# Patient Record
Sex: Male | Born: 1970 | Race: Black or African American | Hispanic: No | Marital: Single | Smoking: Never smoker
Health system: Southern US, Community
[De-identification: ages and names within clinical notes are randomized; demographics above are authoritative.]

## PROBLEM LIST (undated history)

## (undated) DIAGNOSIS — G4730 Sleep apnea, unspecified: Secondary | ICD-10-CM

## (undated) DIAGNOSIS — E785 Hyperlipidemia, unspecified: Secondary | ICD-10-CM

## (undated) HISTORY — PX: KNEE ARTHROSCOPY W/ ACL RECONSTRUCTION: SHX1858

---

## 2004-10-18 ENCOUNTER — Ambulatory Visit: Payer: Self-pay | Admitting: Unknown Physician Specialty

## 2007-08-28 ENCOUNTER — Ambulatory Visit: Payer: Self-pay

## 2014-03-16 ENCOUNTER — Emergency Department: Payer: Self-pay | Admitting: Emergency Medicine

## 2016-08-21 IMAGING — CT CT ABD-PELV W/ CM
2 of 6 series · 15 of 46 positions shown, 17 images · IV contrast (agent unspecified)
Comparison: None.

CLINICAL DATA: 44-year-old male with a history of right lower
quadrant pain and right flank pain.

EXAM:
CT ABDOMEN AND PELVIS WITH CONTRAST
TECHNIQUE: Multidetector CT imaging of the abdomen and pelvis was performed
using the standard protocol following bolus administration of
intravenous contrast.
CONTRAST:  IV contrast

[Series 2: routine abd pel with · axial · 0.65mm/px · z∈[-453,-63]mm · 12 of 90 slices shown, 14 images]
[im 6/90  soft-tissue]
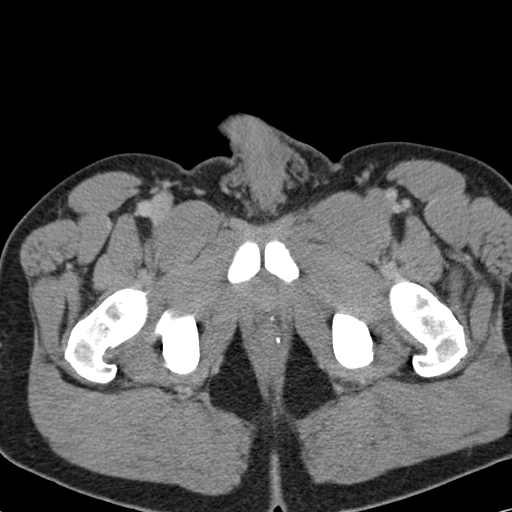
[im 6/90  bone]
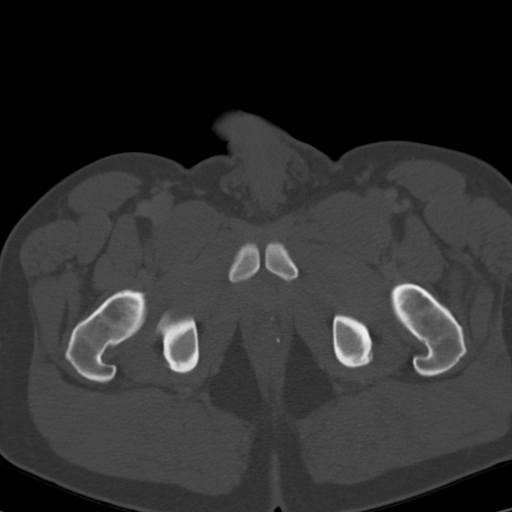
[im 16/90  soft-tissue]
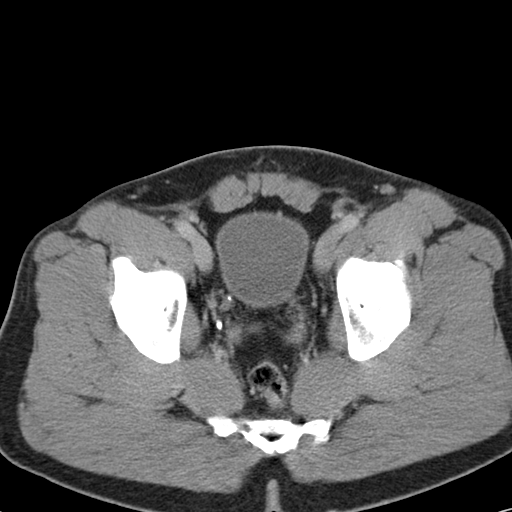
[im 21/90  soft-tissue]
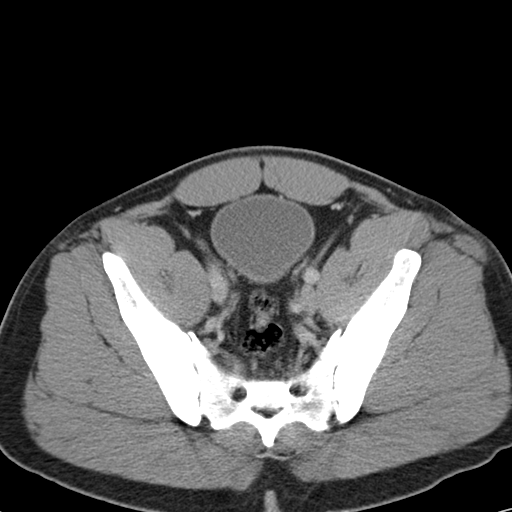
[im 27/90  soft-tissue]
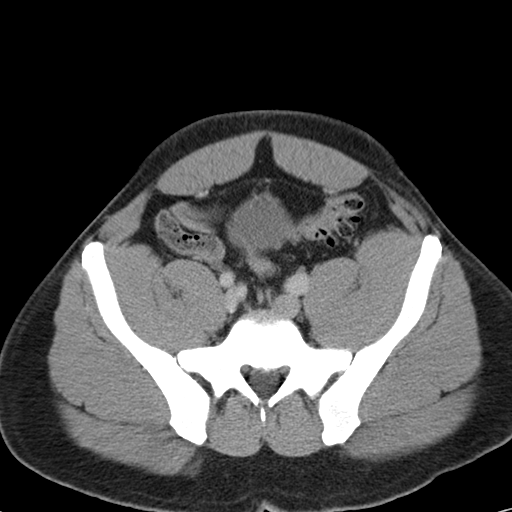
[im 37/90  soft-tissue]
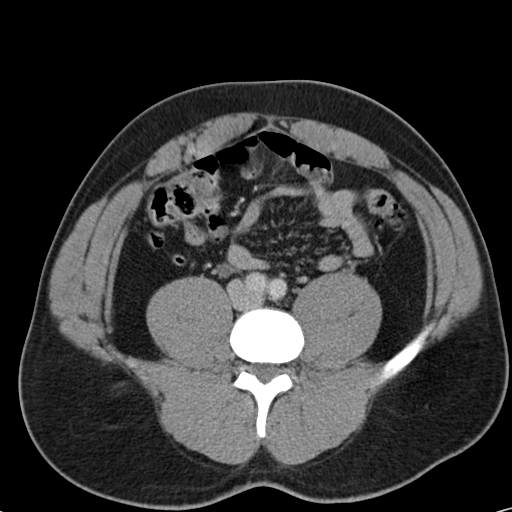
[im 42/90  soft-tissue]
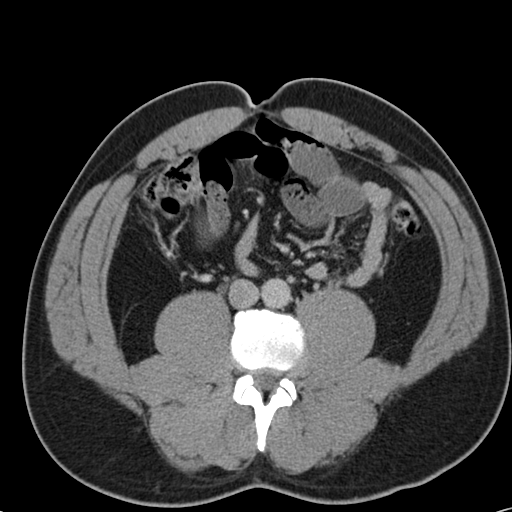
[im 48/90  soft-tissue]
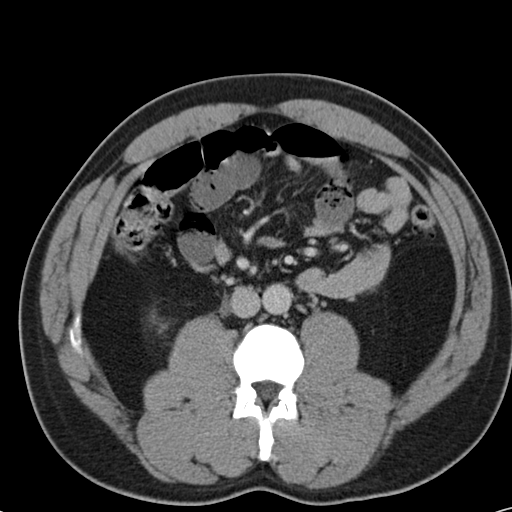
[im 58/90  soft-tissue]
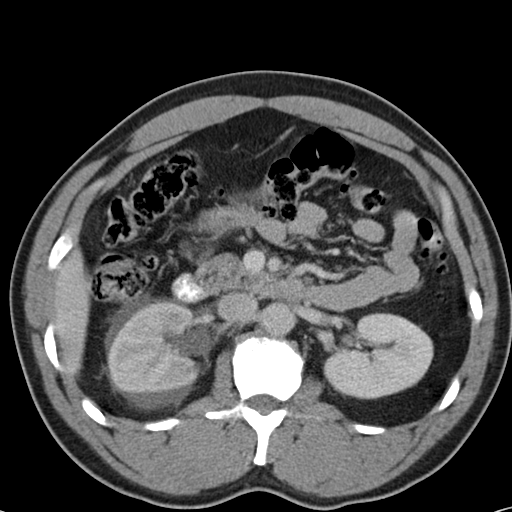
[im 63/90  soft-tissue]
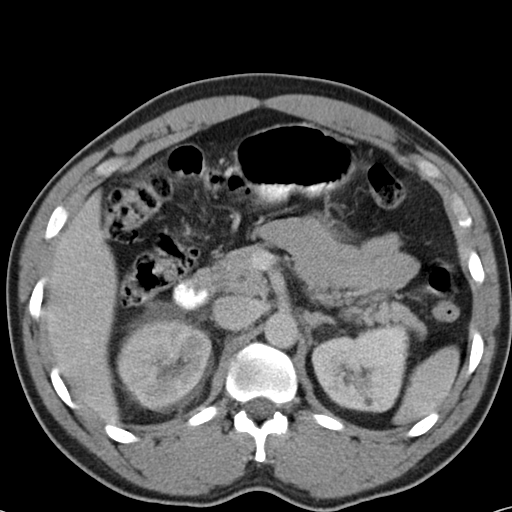
[im 63/90  bone]
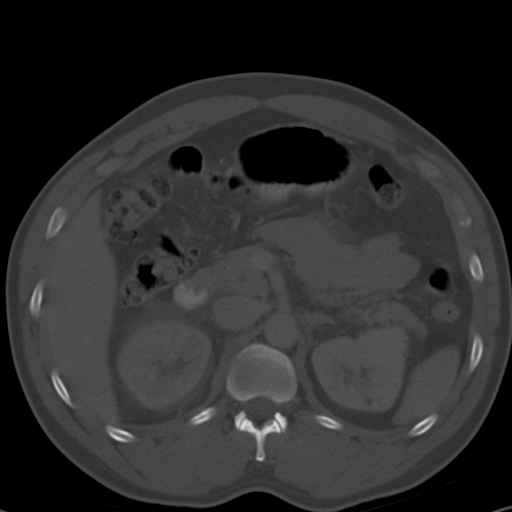
[im 69/90  soft-tissue]
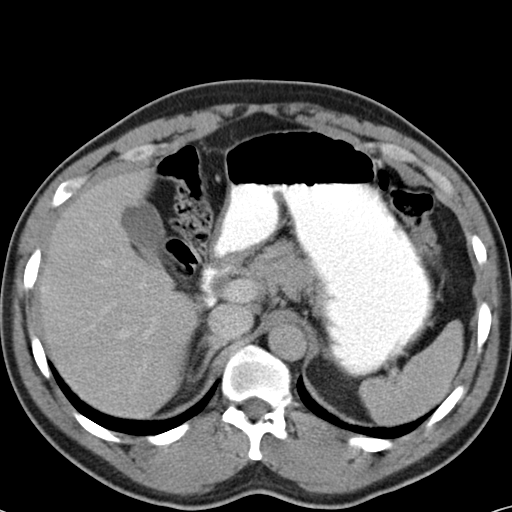
[im 79/90  soft-tissue]
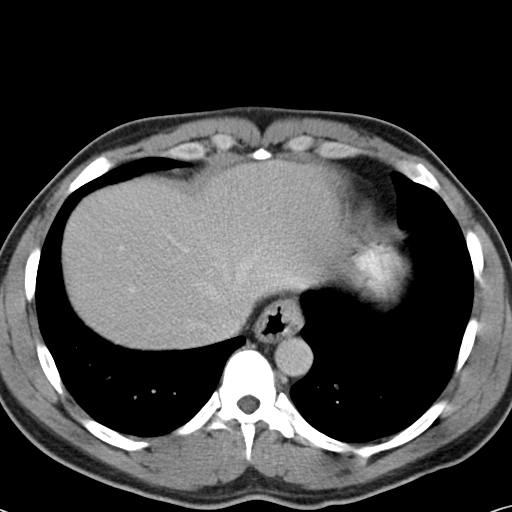
[im 84/90  soft-tissue]
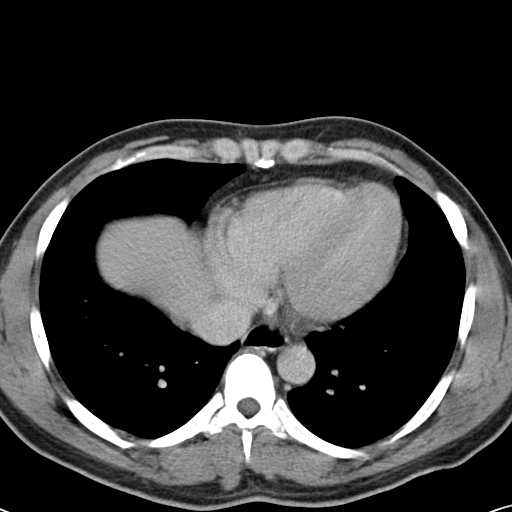

[Series 7: cor routine abd pel with · coronal · 0.64mm/px · 3 of 134 slices shown]
[im 45/134  soft-tissue]
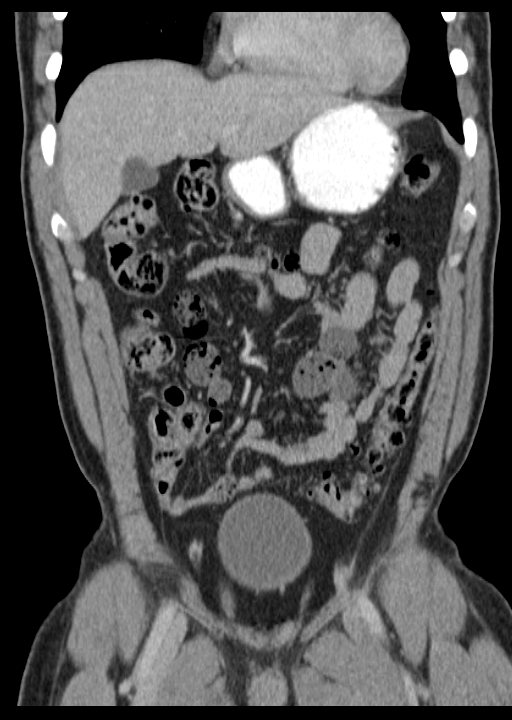
[im 60/134  soft-tissue]
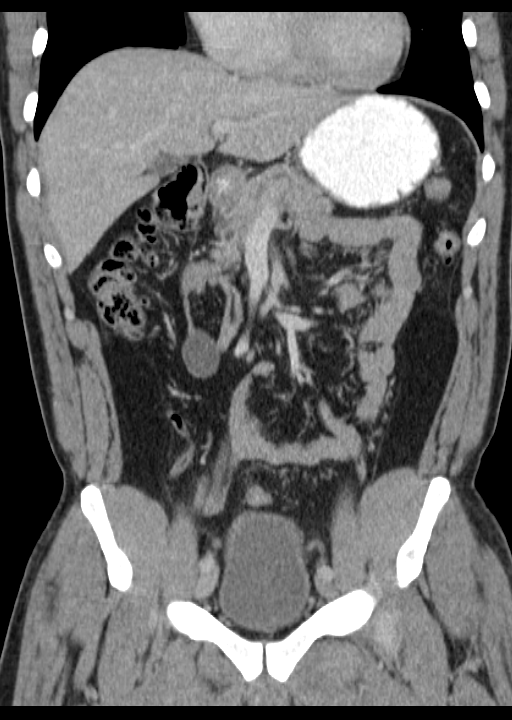
[im 74/134  soft-tissue]
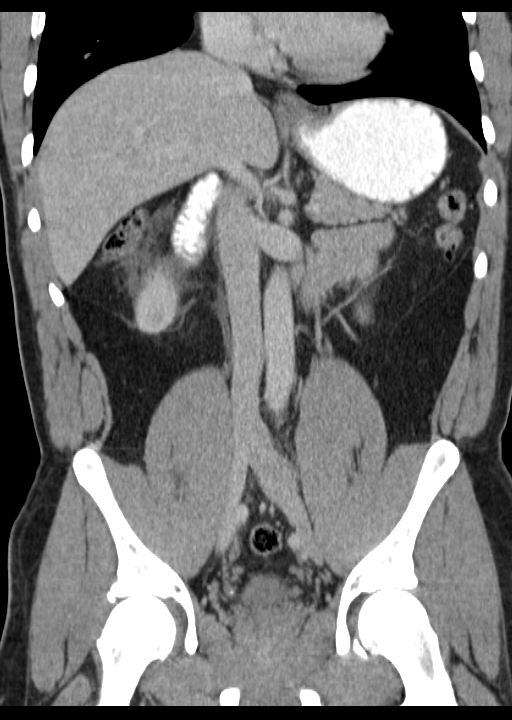

[15 of 46 positions shown; findings below may reference images not displayed]

FINDINGS: Lower chest:

Unremarkable appearance of the soft tissues of the chest wall.

Heart size within normal limits.  No pericardial fluid/thickening.

No lower mediastinal adenopathy.

Unremarkable appearance of the distal esophagus.

Small hiatal hernia

No confluent airspace disease, pleural fluid, or pneumothorax within
visualized lung.

Abdomen/pelvis:

Unremarkable appearance of liver and spleen.

Unremarkable appearance of the adrenal glands.

No peripancreatic or pericholecystic fluid or inflammation. No
radiopaque gallstones.

No intrahepatic or extrahepatic biliary ductal dilatation.

No free air or significant free fluid within the peritoneum.

Significant perinephric stranding/fluid on the right, with mild
hydronephrosis. There is dilation of the entire right ureter, and a
small, 2 mm ureteral stone just above the ureterovesical junction.

Unremarkable appearance of the left ureter. There is a left sided
small nonobstructive 2 mm stone. No left-sided hydronephrosis.

Unremarkable appearance of the urinary bladder.

Enteric contrast within the stomach, which has not extended beyond
the duodenum.

No abnormally distended small bowel or colon.  Normal appendix.

Multiple colonic diverticula.
IMPRESSION: Obstructive 2 mm right ureteral stone just proximal to the
ureterovesical junction. There is resulting mild hydronephrosis,
with right-sided perinephric stranding. If there is concern for
infection, recommend correlation with urinalysis.

Small nonobstructive left-sided kidney stone measuring 2 mm in the
lateral cortex/calyx.

Diverticular disease without associated inflammation.

These results were called by telephone at the time of interpretation
on 03/16/2014 at [DATE] to Dr. ISARIS GOBETTO , who verbally
acknowledged these results.

## 2020-01-09 ENCOUNTER — Other Ambulatory Visit: Payer: Managed Care, Other (non HMO) | Attending: Gastroenterology

## 2020-01-11 ENCOUNTER — Ambulatory Visit: Admission: RE | Admit: 2020-01-11 | Payer: Managed Care, Other (non HMO) | Source: Home / Self Care

## 2020-01-11 ENCOUNTER — Encounter: Admission: RE | Payer: Self-pay | Source: Home / Self Care

## 2020-01-11 SURGERY — COLONOSCOPY WITH PROPOFOL
Anesthesia: General

## 2021-09-02 ENCOUNTER — Other Ambulatory Visit: Payer: Self-pay | Admitting: Student

## 2021-09-02 DIAGNOSIS — Z136 Encounter for screening for cardiovascular disorders: Secondary | ICD-10-CM

## 2021-09-08 ENCOUNTER — Other Ambulatory Visit: Payer: Managed Care, Other (non HMO)

## 2021-10-06 ENCOUNTER — Ambulatory Visit
Admission: RE | Admit: 2021-10-06 | Discharge: 2021-10-06 | Disposition: A | Discharge status: Home / Self Care | Payer: Commercial Managed Care - HMO | Source: Ambulatory Visit | Attending: Student | Admitting: Student

## 2021-10-06 DIAGNOSIS — Z136 Encounter for screening for cardiovascular disorders: Secondary | ICD-10-CM | POA: Insufficient documentation

## 2022-02-03 DIAGNOSIS — G4733 Obstructive sleep apnea (adult) (pediatric): Secondary | ICD-10-CM | POA: Diagnosis not present

## 2022-02-07 DIAGNOSIS — G4733 Obstructive sleep apnea (adult) (pediatric): Secondary | ICD-10-CM | POA: Diagnosis not present

## 2022-03-06 DIAGNOSIS — G4733 Obstructive sleep apnea (adult) (pediatric): Secondary | ICD-10-CM | POA: Diagnosis not present

## 2022-03-10 DIAGNOSIS — G4733 Obstructive sleep apnea (adult) (pediatric): Secondary | ICD-10-CM | POA: Diagnosis not present

## 2022-03-30 DIAGNOSIS — Z1211 Encounter for screening for malignant neoplasm of colon: Secondary | ICD-10-CM | POA: Diagnosis not present

## 2022-03-30 DIAGNOSIS — Z01818 Encounter for other preprocedural examination: Secondary | ICD-10-CM | POA: Diagnosis not present

## 2022-04-04 DIAGNOSIS — G4733 Obstructive sleep apnea (adult) (pediatric): Secondary | ICD-10-CM | POA: Diagnosis not present

## 2022-04-08 DIAGNOSIS — G4733 Obstructive sleep apnea (adult) (pediatric): Secondary | ICD-10-CM | POA: Diagnosis not present

## 2022-04-12 DIAGNOSIS — G4733 Obstructive sleep apnea (adult) (pediatric): Secondary | ICD-10-CM | POA: Diagnosis not present

## 2022-04-12 DIAGNOSIS — R7302 Impaired glucose tolerance (oral): Secondary | ICD-10-CM | POA: Diagnosis not present

## 2022-04-12 DIAGNOSIS — E7800 Pure hypercholesterolemia, unspecified: Secondary | ICD-10-CM | POA: Diagnosis not present

## 2022-04-12 DIAGNOSIS — N200 Calculus of kidney: Secondary | ICD-10-CM | POA: Diagnosis not present

## 2022-05-05 DIAGNOSIS — G4733 Obstructive sleep apnea (adult) (pediatric): Secondary | ICD-10-CM | POA: Diagnosis not present

## 2022-05-08 DIAGNOSIS — G4733 Obstructive sleep apnea (adult) (pediatric): Secondary | ICD-10-CM | POA: Diagnosis not present

## 2022-06-04 DIAGNOSIS — G4733 Obstructive sleep apnea (adult) (pediatric): Secondary | ICD-10-CM | POA: Diagnosis not present

## 2022-06-07 DIAGNOSIS — G4733 Obstructive sleep apnea (adult) (pediatric): Secondary | ICD-10-CM | POA: Diagnosis not present

## 2022-06-25 ENCOUNTER — Encounter: Payer: Self-pay | Admitting: Gastroenterology

## 2022-06-27 NOTE — H&P (Signed)
Pre-Procedure H&P   Patient ID: Bruce Donovan. is a 52 y.o. male.  Gastroenterology Provider: Jaynie Collins, DO  Referring Provider: Dr. Maryjane Hurter PCP: Marina Goodell, MD  Date: 06/28/2022  HPI Bruce Donovan. is a 52 y.o. male who presents today for Colonoscopy for Colorectal cancer screening .  Undergoing initial colonoscopy.  Patient reports regular bowel movements daily without melena or hematochezia.  Creatinine 1.3 hemoglobin 14.8 MCV 86.7  No family history of colon cancer or colon polyps   Past Medical History:  Diagnosis Date   HLD (hyperlipidemia)    Sleep apnea     Past Surgical History:  Procedure Laterality Date   KNEE ARTHROSCOPY W/ ACL RECONSTRUCTION Bilateral     Family History No h/o GI disease or malignancy  Review of Systems  Constitutional:  Negative for activity change, appetite change, chills, diaphoresis, fatigue, fever and unexpected weight change.  HENT:  Negative for trouble swallowing and voice change.   Respiratory:  Negative for shortness of breath and wheezing.   Cardiovascular:  Negative for chest pain, palpitations and leg swelling.  Gastrointestinal:  Negative for abdominal distention, abdominal pain, anal bleeding, blood in stool, constipation, diarrhea, nausea and vomiting.  Musculoskeletal:  Negative for arthralgias and myalgias.  Skin:  Negative for color change and pallor.  Neurological:  Negative for dizziness, syncope and weakness.  Psychiatric/Behavioral:  Negative for confusion. The patient is not nervous/anxious.   All other systems reviewed and are negative.    Medications No current facility-administered medications on file prior to encounter.   Current Outpatient Medications on File Prior to Encounter  Medication Sig Dispense Refill   atorvastatin (LIPITOR) 10 MG tablet Take 10 mg by mouth daily.      Pertinent medications related to GI and procedure were reviewed by me with the patient prior to  the procedure   Current Facility-Administered Medications:    0.9 %  sodium chloride infusion, , Intravenous, Continuous, Jaynie Collins, DO, Last Rate: 20 mL/hr at 06/28/22 1024, New Bag at 06/28/22 1024  sodium chloride 20 mL/hr at 06/28/22 1024       Not on File Allergies were reviewed by me prior to the procedure  Objective   Body mass index is 31.8 kg/m. Vitals:   06/28/22 1013  BP: (!) 135/91  Pulse: 75  Resp: 18  Temp: (!) 97.1 F (36.2 C)  TempSrc: Temporal  SpO2: 98%  Weight: 103.4 kg  Height: 5\' 11"  (1.803 m)     Physical Exam Vitals and nursing note reviewed.  Constitutional:      General: He is not in acute distress.    Appearance: Normal appearance. He is not ill-appearing, toxic-appearing or diaphoretic.  HENT:     Head: Normocephalic and atraumatic.     Nose: Nose normal.     Mouth/Throat:     Mouth: Mucous membranes are moist.     Pharynx: Oropharynx is clear.  Eyes:     General: No scleral icterus.    Extraocular Movements: Extraocular movements intact.  Cardiovascular:     Rate and Rhythm: Normal rate and regular rhythm.     Heart sounds: Normal heart sounds. No murmur heard.    No friction rub. No gallop.  Pulmonary:     Effort: Pulmonary effort is normal. No respiratory distress.     Breath sounds: Normal breath sounds. No wheezing, rhonchi or rales.  Abdominal:     General: Bowel sounds are normal. There is no  distension.     Palpations: Abdomen is soft.     Tenderness: There is no abdominal tenderness. There is no guarding or rebound.  Musculoskeletal:     Cervical back: Neck supple.     Right lower leg: No edema.     Left lower leg: No edema.  Skin:    General: Skin is warm and dry.     Coloration: Skin is not jaundiced or pale.  Neurological:     General: No focal deficit present.     Mental Status: He is alert and oriented to person, place, and time. Mental status is at baseline.  Psychiatric:        Mood and Affect:  Mood normal.        Behavior: Behavior normal.        Thought Content: Thought content normal.        Judgment: Judgment normal.      Assessment:  Mr. Bruce Donovan. is a 52 y.o. male  who presents today for Colonoscopy for Colorectal cancer screening .  Plan:  Colonoscopy with possible intervention today  Colonoscopy with possible biopsy, control of bleeding, polypectomy, and interventions as necessary has been discussed with the patient/patient representative. Informed consent was obtained from the patient/patient representative after explaining the indication, nature, and risks of the procedure including but not limited to death, bleeding, perforation, missed neoplasm/lesions, cardiorespiratory compromise, and reaction to medications. Opportunity for questions was given and appropriate answers were provided. Patient/patient representative has verbalized understanding is amenable to undergoing the procedure.   Jaynie Collins, DO  [facility]  Portions of the record may have been created with voice recognition software. Occasional wrong-word or 'sound-a-like' substitutions may have occurred due to the inherent limitations of voice recognition software.  Read the chart carefully and recognize, using context, where substitutions may have occurred.

## 2022-06-28 ENCOUNTER — Other Ambulatory Visit: Payer: Self-pay

## 2022-06-28 ENCOUNTER — Ambulatory Visit: Payer: Medicaid Other | Admitting: Anesthesiology

## 2022-06-28 ENCOUNTER — Ambulatory Visit
Admission: RE | Admit: 2022-06-28 | Discharge: 2022-06-28 | Disposition: A | Discharge status: Home / Self Care | Payer: Medicaid Other | Source: Ambulatory Visit | Attending: Gastroenterology | Admitting: Gastroenterology

## 2022-06-28 ENCOUNTER — Encounter: Payer: Self-pay | Admitting: Gastroenterology

## 2022-06-28 ENCOUNTER — Encounter
Admission: RE | Disposition: A | Discharge status: Home / Self Care | Payer: Self-pay | Source: Ambulatory Visit | Attending: Gastroenterology

## 2022-06-28 DIAGNOSIS — K5730 Diverticulosis of large intestine without perforation or abscess without bleeding: Secondary | ICD-10-CM | POA: Insufficient documentation

## 2022-06-28 DIAGNOSIS — K648 Other hemorrhoids: Secondary | ICD-10-CM | POA: Diagnosis not present

## 2022-06-28 DIAGNOSIS — D124 Benign neoplasm of descending colon: Secondary | ICD-10-CM | POA: Diagnosis not present

## 2022-06-28 DIAGNOSIS — K640 First degree hemorrhoids: Secondary | ICD-10-CM | POA: Insufficient documentation

## 2022-06-28 DIAGNOSIS — D122 Benign neoplasm of ascending colon: Secondary | ICD-10-CM | POA: Insufficient documentation

## 2022-06-28 DIAGNOSIS — D125 Benign neoplasm of sigmoid colon: Secondary | ICD-10-CM | POA: Insufficient documentation

## 2022-06-28 DIAGNOSIS — D120 Benign neoplasm of cecum: Secondary | ICD-10-CM | POA: Diagnosis not present

## 2022-06-28 DIAGNOSIS — Z1211 Encounter for screening for malignant neoplasm of colon: Secondary | ICD-10-CM | POA: Diagnosis not present

## 2022-06-28 DIAGNOSIS — K635 Polyp of colon: Secondary | ICD-10-CM | POA: Diagnosis not present

## 2022-06-28 HISTORY — DX: Sleep apnea, unspecified: G47.30

## 2022-06-28 HISTORY — DX: Hyperlipidemia, unspecified: E78.5

## 2022-06-28 HISTORY — PX: COLONOSCOPY WITH PROPOFOL: SHX5780

## 2022-06-28 SURGERY — COLONOSCOPY WITH PROPOFOL
Anesthesia: General

## 2022-06-28 MED ORDER — DEXMEDETOMIDINE HCL IN NACL 80 MCG/20ML IV SOLN
INTRAVENOUS | Status: DC | PRN
  Administered 2022-06-28: 8 ug via INTRAVENOUS

## 2022-06-28 MED ORDER — LIDOCAINE HCL (CARDIAC) PF 50 MG/5ML IV SOSY
PREFILLED_SYRINGE | INTRAVENOUS | Status: DC | PRN
  Administered 2022-06-28: 100 mg via INTRAVENOUS

## 2022-06-28 MED ORDER — PROPOFOL 500 MG/50ML IV EMUL
INTRAVENOUS | Status: DC | PRN
  Administered 2022-06-28: 150 ug/kg/min via INTRAVENOUS
  Administered 2022-06-28: 50 mg via INTRAVENOUS

## 2022-06-28 MED ORDER — SODIUM CHLORIDE 0.9 % IV SOLN: INTRAVENOUS | Status: DC

## 2022-06-28 NOTE — Anesthesia Preprocedure Evaluation (Addendum)
Anesthesia Evaluation  Patient identified by MRN, date of birth, ID band Patient awake    Reviewed: Allergy & Precautions, H&P , NPO status , Patient's Chart, lab work & pertinent test results  Airway Mallampati: III  TM Distance: >3 FB Neck ROM: full    Dental  (+) Partial Lower   Pulmonary sleep apnea    Pulmonary exam normal        Cardiovascular negative cardio ROS Normal cardiovascular exam     Neuro/Psych negative neurological ROS  negative psych ROS   GI/Hepatic negative GI ROS, Neg liver ROS,,,  Endo/Other  negative endocrine ROS    Renal/GU negative Renal ROS  negative genitourinary   Musculoskeletal   Abdominal  (+) + obese  Peds  Hematology negative hematology ROS (+)   Anesthesia Other Findings    Reproductive/Obstetrics negative OB ROS                             Anesthesia Physical Anesthesia Plan  ASA: 2  Anesthesia Plan: General   Post-op Pain Management: Minimal or no pain anticipated   Induction: Intravenous  PONV Risk Score and Plan: Propofol infusion and TIVA  Airway Management Planned: Natural Airway  Additional Equipment:   Intra-op Plan:   Post-operative Plan:   Informed Consent: I have reviewed the patients History and Physical, chart, labs and discussed the procedure including the risks, benefits and alternatives for the proposed anesthesia with the patient or authorized representative who has indicated his/her understanding and acceptance.     Dental Advisory Given  Plan Discussed with: CRNA and Surgeon  Anesthesia Plan Comments:         Anesthesia Quick Evaluation

## 2022-06-28 NOTE — Anesthesia Postprocedure Evaluation (Signed)
Anesthesia Post Note  Patient: Berley Tibbetts.  Procedure(s) Performed: COLONOSCOPY WITH PROPOFOL  Patient location during evaluation: Endoscopy Anesthesia Type: General Level of consciousness: awake and alert Pain management: pain level controlled Vital Signs Assessment: post-procedure vital signs reviewed and stable Respiratory status: spontaneous breathing, nonlabored ventilation and respiratory function stable Cardiovascular status: blood pressure returned to baseline and stable Postop Assessment: no apparent nausea or vomiting Anesthetic complications: no   There were no known notable events for this encounter.   Last Vitals:  Vitals:   06/28/22 1129 06/28/22 1139  BP: (!) 142/96 (!) 153/110  Pulse: 61 62  Resp: 20 17  Temp:    SpO2: 99% 100%    Last Pain:  Vitals:   06/28/22 1139  TempSrc:   PainSc: 0-No pain                 Foye Deer

## 2022-06-28 NOTE — Interval H&P Note (Signed)
History and Physical Interval Note: Preprocedure H&P from 06/28/22  was reviewed and there was no interval change after seeing and examining the patient.  Written consent was obtained from the patient after discussion of risks, benefits, and alternatives. Patient has consented to proceed with Colonoscopy with possible intervention   06/28/2022 10:37 AM  Bruce Donovan.  has presented today for surgery, with the diagnosis of colon cancer screening.  The various methods of treatment have been discussed with the patient and family. After consideration of risks, benefits and other options for treatment, the patient has consented to  Procedure(s): COLONOSCOPY WITH PROPOFOL (N/A) as a surgical intervention.  The patient's history has been reviewed, patient examined, no change in status, stable for surgery.  I have reviewed the patient's chart and labs.  Questions were answered to the patient's satisfaction.     Jaynie Collins

## 2022-06-28 NOTE — Transfer of Care (Signed)
Immediate Anesthesia Transfer of Care Note  Patient: Bruce Donovan.  Procedure(s) Performed: COLONOSCOPY WITH PROPOFOL  Patient Location: PACU  Anesthesia Type:General  Level of Consciousness: awake, alert , and oriented  Airway & Oxygen Therapy: Patient Spontanous Breathing  Post-op Assessment: Report given to RN and Post -op Vital signs reviewed and stable  Post vital signs: stable  Last Vitals:  Vitals Value Taken Time  BP 132/85 06/28/22 1119  Temp 36.1 C 06/28/22 1119  Pulse 61 06/28/22 1121  Resp 17 06/28/22 1121  SpO2 100 % 06/28/22 1121  Vitals shown include unvalidated device data.  Last Pain:  Vitals:   06/28/22 1013  TempSrc: Temporal  PainSc: 0-No pain         Complications: No notable events documented.

## 2022-06-28 NOTE — Op Note (Signed)
[facility] Gastroenterology Patient Name: Bruce Donovan Procedure Date: 06/28/2022 10:29 AM MRN: [id number] Account #: [id number] Date of Birth: 1970/11/04 Admit Type: Outpatient Age: 52 Room: [facility] ENDO ROOM 2 Gender: Male Note Status: Finalized Instrument Name: Colonoscope [id number] Procedure:             Colonoscopy Indications:           Screening for colorectal malignant neoplasm Providers:             Jaynie Collins DO, DO Medicines:             Monitored Anesthesia Care Complications:         No immediate complications. Estimated blood loss:                         Minimal. Procedure:             Pre-Anesthesia Assessment:                        - Prior to the procedure, a History and Physical was                         performed, and patient medications and allergies were                         reviewed. The patient is competent. The risks and                         benefits of the procedure and the sedation options and                         risks were discussed with the patient. All questions                         were answered and informed consent was obtained.                         Patient identification and proposed procedure were                         verified by the physician, the nurse, the anesthetist                         and the technician in the endoscopy suite. Mental                         Status Examination: alert and oriented. Airway                         Examination: normal oropharyngeal airway and neck                         mobility. Respiratory Examination: clear to                         auscultation. CV Examination: RRR, no murmurs, no S3                         or S4. Prophylactic Antibiotics: The patient does not  require prophylactic antibiotics. Prior                         Anticoagulants: The patient has taken no anticoagulant                         or antiplatelet agents. ASA Grade  Assessment: II - A                         patient with mild systemic disease. After reviewing                         the risks and benefits, the patient was deemed in                         satisfactory condition to undergo the procedure. The                         anesthesia plan was to use monitored anesthesia care                         (MAC). Immediately prior to administration of                         medications, the patient was re-assessed for adequacy                         to receive sedatives. The heart rate, respiratory                         rate, oxygen saturations, blood pressure, adequacy of                         pulmonary ventilation, and response to care were                         monitored throughout the procedure. The physical                         status of the patient was re-assessed after the                         procedure.                        After obtaining informed consent, the colonoscope was                         passed under direct vision. Throughout the procedure,                         the patient's blood pressure, pulse, and oxygen                         saturations were monitored continuously. The                         Colonoscope was introduced through the anus and  advanced to the the terminal ileum, with                         identification of the appendiceal orifice and IC                         valve. The colonoscopy was performed without                         difficulty. The patient tolerated the procedure well.                         The quality of the bowel preparation was evaluated                         using the BBPS [facility] Bowel Preparation Scale) with                         scores of: Right Colon = 2 (minor amount of residual                         staining, small fragments of stool and/or opaque                         liquid, but mucosa seen well), Transverse Colon = 3                          (entire mucosa seen well with no residual staining,                         small fragments of stool or opaque liquid) and Left                         Colon = 2 (minor amount of residual staining, small                         fragments of stool and/or opaque liquid, but mucosa                         seen well). The total BBPS score equals 7. The quality                         of the bowel preparation was good. The terminal ileum,                         ileocecal valve, appendiceal orifice, and rectum were                         photographed. Findings:      The perianal and digital rectal examinations were normal. Pertinent       negatives include normal sphincter tone.      The terminal ileum appeared normal. Estimated blood loss: none.      Retroflexion in the right colon was performed.      Multiple small-mouthed diverticula were found in the left colon.       Estimated blood loss: none.      Non-bleeding internal hemorrhoids were  found during retroflexion. The       hemorrhoids were Grade I (internal hemorrhoids that do not prolapse).       Estimated blood loss: none.      Six sessile polyps were found in the sigmoid colon (1), descending colon       (2) and ascending colon (3). The polyps were 1 to 2 mm in size. These       polyps were removed with a jumbo cold forceps. Resection and retrieval       were complete. Estimated blood loss was minimal.      Six sessile polyps were found in the sigmoid colon, ascending colon and       cecum. The polyps were 2 to 6 mm in size. These polyps were removed with       a cold snare. Resection and retrieval were complete. Estimated blood       loss was minimal.      The exam was otherwise without abnormality on direct and retroflexion       views. Impression:            - The examined portion of the ileum was normal.                        - Diverticulosis in the left colon.                        - Non-bleeding internal  hemorrhoids.                        - Six 1 to 2 mm polyps in the sigmoid colon, in the                         descending colon and in the ascending colon, removed                         with a jumbo cold forceps. Resected and retrieved.                        - Six 2 to 6 mm polyps in the sigmoid colon, in the                         ascending colon and in the cecum, removed with a cold                         snare. Resected and retrieved.                        - The examination was otherwise normal on direct and                         retroflexion views. Recommendation:        - Patient has a contact number available for                         emergencies. The signs and symptoms of potential                         delayed complications were discussed with the patient.  Return to normal activities tomorrow. Written                         discharge instructions were provided to the patient.                        - Discharge patient to home.                        - Resume previous diet.                        - Continue present medications.                        - No ibuprofen, naproxen, or other non-steroidal                         anti-inflammatory drugs for 5 days after polyp removal.                        - Repeat colonoscopy in 1 year for surveillance of                         multiple polyps.                        - Return to referring physician as previously                         scheduled.                        - The findings and recommendations were discussed with                         the patient. Procedure Code(s):     --- Professional ---                        [phone], Colonoscopy, flexible; with removal of                         tumor(s), polyp(s), or other lesion(s) by snare                         technique                        45380, 59, Colonoscopy, flexible; with biopsy, single                         or multiple Diagnosis  Code(s):     --- Professional ---                        Z12.11, Encounter for screening for malignant neoplasm                         of colon                        K64.0, First degree hemorrhoids  D12.4, Benign neoplasm of descending colon                        D12.5, Benign neoplasm of sigmoid colon                        D12.2, Benign neoplasm of ascending colon                        D12.0, Benign neoplasm of cecum                        K57.30, Diverticulosis of large intestine without                         perforation or abscess without bleeding CPT copyright 2022 [employer]. All rights reserved. The codes documented in this report are preliminary and upon coder review may  be revised to meet current compliance requirements. Attending Participation:      I personally performed the entire procedure. Elfredia Nevins, DO Jaynie Collins DO, DO 06/28/2022 11:20:33 AM This report has been signed electronically. Number of Addenda: 0 Note Initiated On: 06/28/2022 10:29 AM Scope Withdrawal Time: 0 hours 19 minutes 0 seconds  Total Procedure Duration: 0 hours 25 minutes 48 seconds  Estimated Blood Loss:  Estimated blood loss was minimal.      [facility]

## 2022-06-29 ENCOUNTER — Encounter: Payer: Self-pay | Admitting: Gastroenterology

## 2022-07-05 DIAGNOSIS — G4733 Obstructive sleep apnea (adult) (pediatric): Secondary | ICD-10-CM | POA: Diagnosis not present

## 2022-08-04 DIAGNOSIS — G4733 Obstructive sleep apnea (adult) (pediatric): Secondary | ICD-10-CM | POA: Diagnosis not present

## 2022-09-04 DIAGNOSIS — G4733 Obstructive sleep apnea (adult) (pediatric): Secondary | ICD-10-CM | POA: Diagnosis not present

## 2022-09-06 DIAGNOSIS — G4733 Obstructive sleep apnea (adult) (pediatric): Secondary | ICD-10-CM | POA: Diagnosis not present

## 2022-10-07 DIAGNOSIS — G4733 Obstructive sleep apnea (adult) (pediatric): Secondary | ICD-10-CM | POA: Diagnosis not present

## 2022-10-20 DIAGNOSIS — E7800 Pure hypercholesterolemia, unspecified: Secondary | ICD-10-CM | POA: Diagnosis not present

## 2022-10-20 DIAGNOSIS — N200 Calculus of kidney: Secondary | ICD-10-CM | POA: Diagnosis not present

## 2022-10-20 DIAGNOSIS — Z0000 Encounter for general adult medical examination without abnormal findings: Secondary | ICD-10-CM | POA: Diagnosis not present

## 2022-10-20 DIAGNOSIS — G4733 Obstructive sleep apnea (adult) (pediatric): Secondary | ICD-10-CM | POA: Diagnosis not present

## 2022-10-20 DIAGNOSIS — Z125 Encounter for screening for malignant neoplasm of prostate: Secondary | ICD-10-CM | POA: Diagnosis not present

## 2022-10-20 DIAGNOSIS — R7302 Impaired glucose tolerance (oral): Secondary | ICD-10-CM | POA: Diagnosis not present

## 2023-02-03 DIAGNOSIS — G4733 Obstructive sleep apnea (adult) (pediatric): Secondary | ICD-10-CM | POA: Diagnosis not present

## 2023-05-09 DIAGNOSIS — N200 Calculus of kidney: Secondary | ICD-10-CM | POA: Diagnosis not present

## 2023-05-09 DIAGNOSIS — E7800 Pure hypercholesterolemia, unspecified: Secondary | ICD-10-CM | POA: Diagnosis not present

## 2023-05-09 DIAGNOSIS — M898X8 Other specified disorders of bone, other site: Secondary | ICD-10-CM | POA: Diagnosis not present

## 2023-05-09 DIAGNOSIS — R7302 Impaired glucose tolerance (oral): Secondary | ICD-10-CM | POA: Diagnosis not present

## 2023-05-09 DIAGNOSIS — G4733 Obstructive sleep apnea (adult) (pediatric): Secondary | ICD-10-CM | POA: Diagnosis not present
